# Patient Record
Sex: Male | Born: 1980 | Hispanic: No | Marital: Single | Smoking: Current every day smoker
Health system: Southern US, Community
[De-identification: ages and names within clinical notes are randomized; demographics above are authoritative.]

## PROBLEM LIST (undated history)

## (undated) DIAGNOSIS — F329 Major depressive disorder, single episode, unspecified: Secondary | ICD-10-CM

## (undated) DIAGNOSIS — R0600 Dyspnea, unspecified: Secondary | ICD-10-CM

## (undated) DIAGNOSIS — F32A Depression, unspecified: Secondary | ICD-10-CM

## (undated) DIAGNOSIS — Q249 Congenital malformation of heart, unspecified: Secondary | ICD-10-CM

## (undated) DIAGNOSIS — L732 Hidradenitis suppurativa: Secondary | ICD-10-CM

## (undated) DIAGNOSIS — F17200 Nicotine dependence, unspecified, uncomplicated: Secondary | ICD-10-CM

## (undated) HISTORY — DX: Depression, unspecified: F32.A

## (undated) HISTORY — DX: Dyspnea, unspecified: R06.00

## (undated) HISTORY — DX: Hidradenitis suppurativa: L73.2

## (undated) HISTORY — DX: Congenital malformation of heart, unspecified: Q24.9

## (undated) HISTORY — DX: Nicotine dependence, unspecified, uncomplicated: F17.200

## (undated) HISTORY — DX: Major depressive disorder, single episode, unspecified: F32.9

---

## 2008-08-05 ENCOUNTER — Encounter: Admission: RE | Admit: 2008-08-05 | Discharge: 2008-08-05 | Payer: Self-pay | Admitting: Pulmonary Disease

## 2009-10-17 ENCOUNTER — Ambulatory Visit: Payer: Self-pay | Admitting: Internal Medicine

## 2010-03-14 ENCOUNTER — Encounter (INDEPENDENT_AMBULATORY_CARE_PROVIDER_SITE_OTHER): Payer: Self-pay | Admitting: Family Medicine

## 2010-03-14 ENCOUNTER — Ambulatory Visit: Payer: Self-pay | Admitting: Internal Medicine

## 2010-03-14 LAB — CONVERTED CEMR LAB
ALT: 27 units/L (ref 0–53)
AST: 15 units/L (ref 0–37)
Albumin: 4.4 g/dL (ref 3.5–5.2)
CO2: 28 meq/L (ref 19–32)
Cholesterol: 196 mg/dL (ref 0–200)
Creatinine, Ser: 0.8 mg/dL (ref 0.40–1.50)
HDL: 32 mg/dL — ABNORMAL LOW (ref >39)
Sodium: 139 meq/L (ref 135–145)
Total Bilirubin: 0.3 mg/dL (ref 0.3–1.2)
Total Protein: 6.3 g/dL (ref 6.0–8.3)
Triglycerides: 308 mg/dL — ABNORMAL HIGH (ref <150)

## 2010-07-04 ENCOUNTER — Emergency Department (HOSPITAL_COMMUNITY)
Admission: EM | Admit: 2010-07-04 | Discharge: 2010-07-04 | Payer: Self-pay | Source: Home / Self Care | Admitting: Emergency Medicine

## 2011-10-18 ENCOUNTER — Emergency Department (HOSPITAL_COMMUNITY)
Admission: EM | Admit: 2011-10-18 | Discharge: 2011-10-18 | Disposition: A | Discharge status: Home / Self Care | Payer: PRIVATE HEALTH INSURANCE | Attending: Emergency Medicine | Admitting: Emergency Medicine

## 2011-10-18 ENCOUNTER — Other Ambulatory Visit: Payer: Self-pay

## 2011-10-18 ENCOUNTER — Encounter (HOSPITAL_COMMUNITY): Payer: Self-pay | Admitting: *Deleted

## 2011-10-18 DIAGNOSIS — R0682 Tachypnea, not elsewhere classified: Secondary | ICD-10-CM | POA: Insufficient documentation

## 2011-10-18 DIAGNOSIS — R0602 Shortness of breath: Secondary | ICD-10-CM | POA: Insufficient documentation

## 2011-10-18 LAB — D-DIMER, QUANTITATIVE: D-Dimer, Quant: <0.22 ug/mL-FEU (ref 0.00–0.48)

## 2011-10-18 MED ORDER — IPRATROPIUM BROMIDE 0.02 % IN SOLN
0.5000 mg | Freq: Once | RESPIRATORY_TRACT | Status: AC
  Administered 2011-10-18: 0.5 mg via RESPIRATORY_TRACT
  Filled 2011-10-18: qty 2.5

## 2011-10-18 MED ORDER — LORAZEPAM 1 MG PO TABS
1.0000 mg | ORAL_TABLET | Freq: Once | ORAL | Status: AC
  Administered 2011-10-18: 1 mg via ORAL
  Filled 2011-10-18: qty 1

## 2011-10-18 MED ORDER — ALBUTEROL SULFATE (5 MG/ML) 0.5% IN NEBU
5.0000 mg | INHALATION_SOLUTION | Freq: Once | RESPIRATORY_TRACT | Status: AC
  Administered 2011-10-18: 5 mg via RESPIRATORY_TRACT
  Filled 2011-10-18: qty 1

## 2011-10-18 NOTE — ED Notes (Signed)
Pt sent over from PMD with difficulty breathing. At triage window O2 sat 99 RA. Pt not in distress, respirations unlabored.

## 2011-10-18 NOTE — ED Provider Notes (Signed)
History     CSN: [id number]  Arrival date & time 10/18/11  1813   First MD Initiated Contact with Patient 10/18/11 2012      Chief Complaint  Patient presents with  . Shortness of Breath    (Consider location/radiation/quality/duration/timing/severity/associated sxs/prior treatment) HPI Comments: Patient presents with acute onset of shortness of breath that started 3 days ago. 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 At that time he had a negative EKG, normal CBC, and normal chest x-ray. He was improved with breathing treatments. He was discharged with an albuterol inhaler and steroids. Shortness of breath waxed and waned until today when it became much worse this afternoon. Patient went back to the clinic and was sent to the emergency department for further evaluation. Patient is from [place]. He denies recent travel, mobilizations, surgeries, history of blood clots. Patient had rheumatic fever when he was a child. He denies fever, upper respiratory tract infection symptoms, chest pain, nausea, vomiting, diarrhea. Patient is a smoker.  Patient is a 31 y.o. male presenting with shortness of breath. The history is provided by the patient.  Shortness of Breath  The current episode started 3 to 5 days ago. The onset was sudden. The problem has been unchanged. The problem is mild. The symptoms are relieved by nothing. Associated symptoms include shortness of breath. Pertinent negatives include no chest pain, no fever, no rhinorrhea, no sore throat, no cough and no wheezing.    History reviewed. No pertinent past medical history.  History reviewed. No pertinent past surgical history.  History reviewed. No pertinent family history.  History  Substance Use Topics  . Smoking status: Not on file  . Smokeless tobacco: Not on file  . Alcohol Use: Not on file      Review of Systems  Constitutional: Negative for fever.  HENT: Negative for sore throat and rhinorrhea.   Eyes:  Negative for redness.  Respiratory: Positive for shortness of breath. Negative for cough and wheezing.   Cardiovascular: Negative for chest pain.  Gastrointestinal: Negative for nausea, vomiting, abdominal pain and diarrhea.  Genitourinary: Negative for dysuria.  Musculoskeletal: Negative for myalgias.  Skin: Negative for rash.  Neurological: Negative for headaches.    Allergies  Review of patient's allergies indicates no known allergies.  Home Medications   Current Outpatient Rx  Name Route Sig Dispense Refill  . ALBUTEROL SULFATE HFA 108 (90 BASE) MCG/ACT IN AERS Inhalation Inhale 2 puffs into the lungs every 6 (six) hours as needed. For shortness of breath    . FLUTICASONE PROPIONATE  HFA 44 MCG/ACT IN AERO Inhalation Inhale 1 puff into the lungs 2 (two) times daily.    Marland Kitchen PREDNISONE 20 MG PO TABS Oral Take 20 mg by mouth daily.      BP 115/76  Pulse 67  Temp(Src) 98.9 F (37.2 C) (Oral)  Resp 22  Wt 164 lb (74.39 kg)  SpO2 100%  Physical Exam  Nursing note and vitals reviewed. Constitutional: He is oriented to person, place, and time. He appears well-developed and well-nourished.  HENT:  Head: Normocephalic and atraumatic.  Right Ear: External ear normal.  Left Ear: External ear normal.  Nose: Nose normal.  Mouth/Throat: Oropharynx is clear and moist.  Eyes: Conjunctivae are normal. Pupils are equal, round, and reactive to light. Right eye exhibits no discharge. Left eye exhibits no discharge.  Neck: Normal range of motion. Neck supple.  Cardiovascular: Normal rate, regular rhythm, normal heart sounds and intact distal pulses.  No murmur heard.      No murmur upright or when leaning forward.  Pulmonary/Chest: Breath sounds normal. No accessory muscle usage. Tachypnea noted. No respiratory distress. He has no decreased breath sounds. He has no wheezes.       Mild tachypnea, 22 respirations per minute  Abdominal: Soft. There is no tenderness.  Musculoskeletal: He  exhibits no edema.  Neurological: He is alert and oriented to person, place, and time.  Skin: Skin is warm and dry.  Psychiatric: He has a normal mood and affect.    ED Course  Procedures (including critical care time)   Labs Reviewed  D-DIMER, QUANTITATIVE   No results found.   1. Shortness of breath     8:50 PM Patient seen and examined. Work-up initiated. Medications ordered.   Vital signs reviewed and are as follows: Filed Vitals:   10/18/11 1913  BP: 115/76  Pulse: 67  Temp: 98.9 F (37.2 C)  Resp: 22    Date: 10/18/2011  Rate: 67  Rhythm: normal sinus rhythm  QRS Axis: normal  Intervals: normal  ST/T Wave abnormalities: normal  Conduction Disutrbances:none  Narrative Interpretation:   Old EKG Reviewed: patient had inverted t-wave in lead III in EKG performed at [facility] on 10/15/11. Otherwise unchanged.   8:50 PM Albuterol given. Patient was discussed with Dr. Juleen China. Dr. Juleen China has seen the patient. D-dimer ordered.  Patient stated that the albuterol did not improve his shortness of breath but made him nervous. Ativan ordered given nervousness. Patient informed of negative d-dimer. Patient states that overall he is feeling improved from earlier this afternoon. He is comfortable and agrees with followup with [facility] doctors. He will decide with his doctors whether an echocardiogram is necessary.  Patient given strict return instructions including worsening shortness of breath, syncope, chest pain, fever, or if he has any other concerns. Patient urged to continue using albuterol as needed and take full course of prednisone.  Reassured patient that testing to date has been normal and unconcerning.   MDM  Patient with shortness of breath, negative x-ray (done previously), unconcerning EKG, negative d-dimer. History of rheumatic fever but no murmur on exam. The patient may need echocardiogram as outpatient. Patient is satting normally. He is not having chest pain. No  life-threatening or dangerous conditions identified or suspected tonight. He has good PCP follow up. Strict return instructions given. Patient appears well and is satting normally at time of discharge.       Renne Crigler, [place] 10/19/11 [id number]

## 2011-10-18 NOTE — ED Provider Notes (Signed)
Medical screening examination/treatment/procedure(s) were conducted as a shared visit with non-physician practitioner(s) and myself.  I personally evaluated the patient during the encounter.  31yM with dyspnea. No distress on exam and lungs clear with good air movement. Low risk for PE and normal d-dimer makes PE unlikely. Doubt infectious. Doubt pneumothorax. Pt doesn't appear to be particularly anxious. Etiology unclear but low suspicion for emergent etiology. Plan symptomatic care. Return precautions discussed.  Raeford Razor, MD 10/18/11 [phone]

## 2011-10-18 NOTE — ED Notes (Signed)
RT called by Sherian Maroon, RN and will be coming shortly.

## 2011-10-18 NOTE — Discharge Instructions (Signed)
Please read and follow all provided instructions.  Your diagnoses today include:  1. Shortness of breath     Tests performed today include:  D-dimer, a test to rule-out blood clots that was negative.   Vital signs. See below for your results today.   Medications prescribed:   None  Home care instructions:  Follow any educational materials contained in this packet.  Follow-up instructions: Please follow-up with your primary care provider in the next 2 days for further evaluation of your symptoms. If you do not have a primary care doctor -- see below for referral information.   Continue using albuterol and taking prednisone as prescribed.   Return instructions:   Please return to the Emergency Department if you experience worsening symptoms.   Return with worsening shortness of breath or trouble breathing.  Return if you pass out or have chest pain.  Please return if you have any other emergent concerns.  Additional Information:  Your vital signs today were: BP 115/76  Pulse 67  Temp(Src) 98.9 F (37.2 C) (Oral)  Resp 22  Wt 164 lb (74.39 kg)  SpO2 98% If your blood pressure (BP) was elevated above 135/85 this visit, please have this repeated by your doctor within one month. -------------- No Primary Care Doctor Call [facility]  [phone] Other agencies that provide inexpensive medical care    [facility]  [phone]    [facility]  [phone]    [facility]  [phone]    [facility]  [phone]    [facility]  [phone]    [facility]  [phone] -------------- RESOURCE GUIDE:  Dental Problems  Patients with Medicaid: [facility] [phone] [address]                                            [phone] [address] Phone:  [phone]                                                   Phone:  [phone]  If unable to pay or uninsured, contact:  [facility]  or [facility]. to become qualified for the adult dental clinic.  Chronic Pain Problems Contact [facility]  [phone] Patients need to be referred by their primary care doctor.  Insufficient Money for Medicine Contact [employer]:  call "211" or [facility] [phone].  Psychological Services [facility]  [phone] [facility]  [phone] [facility]   [phone] (emergency services [phone])  Substance Abuse Resources [facility]  [phone] [facility] [phone] [facility] [phone] Floydene Flock 2097658413 Residential & Outpatient Substance Abuse Program  [phone]  Abuse/Neglect [facility] Child Abuse Hotline 878-282-0180 [facility] Child Abuse Hotline (626)115-6703 (After Hours)  Emergency Shelter [facility] [phone]  Maternity Homes Room at the [place] [facility] [place] [phone] [facility] [phone]  [facility]  [facility] Dept. [address] [place]                       [address]      [address]  Blondell Reveal Phone:  [phone]                                   Phone:  [phone]                 Phone:  [phone]  [facility] Phone:  [phone]  [facility] Child Abuse Hotline [phone] (After Hours)

## 2011-10-18 NOTE — ED Notes (Signed)
Pt in c/o shortness of breath and feeling like he can't take a deep breath, evaluated at [facility] medical office and had EKG and chest xray completed, sent for further evaulation

## 2011-10-19 NOTE — ED Provider Notes (Signed)
Medical screening examination/treatment/procedure(s) were conducted as a shared visit with non-physician practitioner(s) and myself.  I personally evaluated the patient during the encounter.  Please see completed note.  Raeford Razor, MD 10/19/11 0040

## 2013-03-23 ENCOUNTER — Institutional Professional Consult (permissible substitution): Payer: Self-pay | Admitting: Internal Medicine

## 2013-04-01 ENCOUNTER — Institutional Professional Consult (permissible substitution): Payer: Self-pay | Admitting: Internal Medicine

## 2013-04-07 ENCOUNTER — Institutional Professional Consult (permissible substitution): Payer: PRIVATE HEALTH INSURANCE | Admitting: Internal Medicine

## 2013-04-21 ENCOUNTER — Institutional Professional Consult (permissible substitution): Payer: PRIVATE HEALTH INSURANCE | Admitting: Internal Medicine

## 2013-04-28 ENCOUNTER — Encounter: Payer: Self-pay | Admitting: Internal Medicine

## 2013-04-28 ENCOUNTER — Ambulatory Visit (INDEPENDENT_AMBULATORY_CARE_PROVIDER_SITE_OTHER)
Admission: RE | Admit: 2013-04-28 | Discharge: 2013-04-28 | Disposition: A | Discharge status: Home / Self Care | Payer: BC Managed Care – PPO | Source: Ambulatory Visit | Attending: Internal Medicine | Admitting: Internal Medicine

## 2013-04-28 ENCOUNTER — Ambulatory Visit (INDEPENDENT_AMBULATORY_CARE_PROVIDER_SITE_OTHER): Payer: BC Managed Care – PPO | Admitting: Internal Medicine

## 2013-04-28 ENCOUNTER — Encounter: Payer: Self-pay | Admitting: *Deleted

## 2013-04-28 VITALS — BP 104/70 | HR 93 | Temp 97.5°F | Ht 68.0 in | Wt 174.8 lb

## 2013-04-28 DIAGNOSIS — R0989 Other specified symptoms and signs involving the circulatory and respiratory systems: Secondary | ICD-10-CM

## 2013-04-28 DIAGNOSIS — R0600 Dyspnea, unspecified: Secondary | ICD-10-CM

## 2013-04-28 DIAGNOSIS — R0609 Other forms of dyspnea: Secondary | ICD-10-CM

## 2013-04-28 NOTE — Progress Notes (Signed)
  Subjective:    Patient ID: Dillon Hogan, male    DOB: [birth year]   MRN: [id number]  HPI  32 yo [place] male smoker studying business at [employer] new onset sob  in 2013   referred to pulmonary 04/28/2013 by Dr Reola Calkins   04/28/2013 1st [facility] office visit/ [name] cc intermittent sob x one year sometimes with sense of chest tightness sometimes with ex, sometimes with sleep s pattern, ? Better p inhalers or prednisone.  Last episode x one month. Longest it's ever lasted = a few hours  shortest= a few minutes. Not necessarily provoked by ex as has worked out at y and gone biking over last month s significant doe.  No obvious day to day or daytime variabilty or assoc chronic cough or cp or   subjective wheeze overt sinus or hb symptoms. No unusual exp hx or h/o childhood pna/ asthma or knowledge of premature birth.  Sleeping ok without nocturnal  or early am exacerbation  of respiratory  c/o's or need for noct saba. Also denies any obvious fluctuation of symptoms with weather or environmental changes or other aggravating or alleviating factors except as outlined above   Current Medications, Allergies, Complete Past Medical History, Past Surgical History, Family History, and Social History were reviewed in [employer] record.         Review of Systems  Constitutional: Negative for fever and unexpected weight change.  HENT: Negative for congestion, dental problem, ear pain, nosebleeds, postnasal drip, rhinorrhea, sinus pressure, sneezing, sore throat and trouble swallowing.   Eyes: Negative for redness and itching.  Respiratory: Positive for chest tightness and shortness of breath. Negative for cough and wheezing.   Cardiovascular: Negative for palpitations and leg swelling.  Gastrointestinal: Negative for nausea and vomiting.  Genitourinary: Negative for dysuria.  Musculoskeletal: Negative for joint swelling.  Skin: Negative for rash.  Neurological: Negative for  headaches.  Hematological: Does not bruise/bleed easily.  Psychiatric/Behavioral: Negative for dysphoric mood. The patient is not nervous/anxious.        Objective:   Physical Exam  amb Iraqy male nad  Wt Readings from Last 3 Encounters:  04/28/13 174 lb 12.8 oz (79.289 kg)  10/18/11 164 lb (74.39 kg)      HEENT: nl dentition, turbinates, and orophanx. Nl external ear canals without cough reflex   NECK :  without JVD/Nodes/TM/ nl carotid upstrokes bilaterally   LUNGS: no acc muscle use, clear to A and P bilaterally without cough on insp or exp maneuvers   CV:  RRR  no s3 or murmur or increase in P2, no edema   ABD:  soft and nontender with nl excursion in the supine position. No bruits or organomegaly, bowel sounds nl  MS:  warm without deformities, calf tenderness, cyanosis or clubbing  SKIN: warm and dry without lesions    NEURO:  alert, approp, no deficits       CXR  04/28/2013 :  No active cardiopulmonary disease.         Assessment & Plan:

## 2013-04-28 NOTE — Patient Instructions (Signed)
Please remember to go to the x-ray department downstairs for your tests - we will call you with the results when they are available.  Please see patient coordinator before you leave today  to schedule a methacholine challenge test  The key is to stop smoking completely before smoking completely stops you!

## 2013-04-29 ENCOUNTER — Encounter: Payer: Self-pay | Admitting: Internal Medicine

## 2013-04-29 NOTE — Assessment & Plan Note (Signed)
Symptoms are markedly disproportionate to objective findings and not clear this is a lung problem but pt does appear to have difficult airway management issues. DDX of  difficult airways managment all start with A and  include Adherence, Ace Inhibitors, Acid Reflux, Active Sinus Disease, Alpha 1 Antitripsin deficiency, Anxiety masquerading as Airways dz,  ABPA,  allergy(esp in young), Aspiration (esp in elderly), Adverse effects of DPI,  Active smokers, plus two Bs  = Bronchiectasis and Beta blocker use..and one C= CHF  Anxiety is typically a dx of exclusion but the pattern of not responding to saba and not being proportionate to ex suggest panic disorder.  ? Allergy/asthma > rec methacholine challenge test  ? Acid (or non-acid) GERD > always difficult to exclude as up to 75% of pts in some series report no assoc GI/ Heartburn symptoms  For now will defer rx  Active smoking > discussed stopping

## 2013-04-30 ENCOUNTER — Ambulatory Visit (HOSPITAL_COMMUNITY)
Admission: RE | Admit: 2013-04-30 | Discharge: 2013-04-30 | Disposition: A | Discharge status: Home / Self Care | Payer: BC Managed Care – PPO | Source: Ambulatory Visit | Attending: Internal Medicine | Admitting: Internal Medicine

## 2013-04-30 DIAGNOSIS — R0989 Other specified symptoms and signs involving the circulatory and respiratory systems: Secondary | ICD-10-CM | POA: Insufficient documentation

## 2013-04-30 DIAGNOSIS — R0609 Other forms of dyspnea: Secondary | ICD-10-CM | POA: Insufficient documentation

## 2013-04-30 MED ORDER — ALBUTEROL SULFATE (5 MG/ML) 0.5% IN NEBU
2.5000 mg | INHALATION_SOLUTION | Freq: Once | RESPIRATORY_TRACT | Status: AC
  Administered 2013-04-30: 2.5 mg via RESPIRATORY_TRACT

## 2013-04-30 MED ORDER — SODIUM CHLORIDE 0.9 % IN NEBU
3.0000 mL | INHALATION_SOLUTION | Freq: Once | RESPIRATORY_TRACT | Status: AC
  Administered 2013-04-30: 3 mL via RESPIRATORY_TRACT

## 2013-04-30 MED ORDER — METHACHOLINE 0.0625 MG/ML NEB SOLN
2.0000 mL | Freq: Once | RESPIRATORY_TRACT | Status: AC
  Administered 2013-04-30: 0.125 mg via RESPIRATORY_TRACT

## 2013-04-30 MED ORDER — METHACHOLINE 1 MG/ML NEB SOLN
2.0000 mL | Freq: Once | RESPIRATORY_TRACT | Status: AC
  Administered 2013-04-30: 2 mg via RESPIRATORY_TRACT

## 2013-04-30 MED ORDER — METHACHOLINE 16 MG/ML NEB SOLN
2.0000 mL | Freq: Once | RESPIRATORY_TRACT | Status: AC
  Administered 2013-04-30: 32 mg via RESPIRATORY_TRACT

## 2013-04-30 MED ORDER — METHACHOLINE 4 MG/ML NEB SOLN
2.0000 mL | Freq: Once | RESPIRATORY_TRACT | Status: AC
  Administered 2013-04-30: 8 mg via RESPIRATORY_TRACT

## 2013-04-30 MED ORDER — METHACHOLINE 0.25 MG/ML NEB SOLN
2.0000 mL | Freq: Once | RESPIRATORY_TRACT | Status: AC
  Administered 2013-04-30: 0.5 mg via RESPIRATORY_TRACT

## 2013-04-30 NOTE — Progress Notes (Signed)
Quick Note:  Spoke with pt and notified of results per Dr. Wert. Pt verbalized understanding and denied any questions.  ______ 

## 2013-04-30 NOTE — Progress Notes (Signed)
Quick Note:  lmomtcb for pt ______ 

## 2013-05-17 ENCOUNTER — Telehealth: Payer: Self-pay | Admitting: Internal Medicine

## 2013-05-17 NOTE — Telephone Encounter (Signed)
Have not seen them, have them fax me the raw data

## 2013-05-17 NOTE — Telephone Encounter (Signed)
Pt is requesting MCT results from 04-30-13. Please advise if you have these results. If not I will call for them. Thanks. Carron Curie, CMA

## 2013-05-17 NOTE — Telephone Encounter (Signed)
Requested report to be faxed. Will await fax. Carron Curie, CMA

## 2013-05-18 NOTE — Telephone Encounter (Signed)
Report placed in MW look-at. Carron Curie, CMA

## 2013-05-19 NOTE — Telephone Encounter (Signed)
I spoke with patient about results and he verbalized understanding and had no questions 

## 2013-05-19 NOTE — Telephone Encounter (Signed)
Preliminary result is neg, needs ov with all meds in hand to regroup within 2 weeks if not 100% better by now

## 2013-10-05 ENCOUNTER — Encounter: Payer: Self-pay | Admitting: Cardiovascular Disease

## 2013-10-05 ENCOUNTER — Ambulatory Visit (INDEPENDENT_AMBULATORY_CARE_PROVIDER_SITE_OTHER): Payer: BC Managed Care – PPO | Admitting: Cardiovascular Disease

## 2013-10-05 ENCOUNTER — Encounter: Payer: Self-pay | Admitting: *Deleted

## 2013-10-05 VITALS — BP 104/72 | HR 101 | Ht 68.0 in | Wt 185.4 lb

## 2013-10-05 DIAGNOSIS — R0600 Dyspnea, unspecified: Secondary | ICD-10-CM

## 2013-10-05 DIAGNOSIS — L732 Hidradenitis suppurativa: Secondary | ICD-10-CM | POA: Insufficient documentation

## 2013-10-05 DIAGNOSIS — F32A Depression, unspecified: Secondary | ICD-10-CM | POA: Insufficient documentation

## 2013-10-05 DIAGNOSIS — R0989 Other specified symptoms and signs involving the circulatory and respiratory systems: Secondary | ICD-10-CM

## 2013-10-05 DIAGNOSIS — F329 Major depressive disorder, single episode, unspecified: Secondary | ICD-10-CM

## 2013-10-05 DIAGNOSIS — F17200 Nicotine dependence, unspecified, uncomplicated: Secondary | ICD-10-CM | POA: Insufficient documentation

## 2013-10-05 DIAGNOSIS — Q249 Congenital malformation of heart, unspecified: Secondary | ICD-10-CM | POA: Insufficient documentation

## 2013-10-05 DIAGNOSIS — R0609 Other forms of dyspnea: Secondary | ICD-10-CM

## 2013-10-05 DIAGNOSIS — F3289 Other specified depressive episodes: Secondary | ICD-10-CM

## 2013-10-05 NOTE — Assessment & Plan Note (Signed)
Doubt cardiac etiology Suspect it is related to his anxiety.  F/U echo

## 2013-10-05 NOTE — Assessment & Plan Note (Signed)
Seems to do better with welbutrin  Likely responsible form mild tachycardia  F/U Dr Piedad ClimesStiles

## 2013-10-05 NOTE — Progress Notes (Signed)
Patient ID: Roderic OvensFaisal Al Dabbagh, male   DOB: 06/06/1981, 33 y.o.   MRN: [id number]     33 yo referred [facility] [employer] [facility] for dyspnea  and history of rheumatic fever.  Has had depression and axniety on paxil initially but changed to welbutrin due to sluggishness  and followed by Dr Piedad ClimesStiles.    Seen by Dr Sherene SiresWert with no definitive diagnosis and negative methacholine challenge study   The history of rheumatic illness related to him by parents when he was a child in [place] He denies history of murmur  Non smoker Mild nasal allergies  No fever cough or sputum  Lifts weights and exercises at school with some exertional dyspna No LE edema or  Clotting abnormalities     ROS: Denies fever, malais, weight loss, blurry vision, decreased visual acuity, cough, sputum, SOB, hemoptysis, pleuritic pain, palpitaitons, heartburn, abdominal pain, melena, lower extremity edema, claudication, or rash.  All other systems reviewed and negative   General: Affect appropriate Healthy:  appears stated age HEENT: normal Neck supple with no adenopathy JVP normal no bruits no thyromegaly Lungs clear with no wheezing and good diaphragmatic motion Heart:  S1/S2 no murmur,rub, gallop or click PMI normal Abdomen: benighn, BS positve, no tenderness, no AAA no bruit.  No HSM or HJR Distal pulses intact with no bruits No edema Neuro non-focal Skin warm and dry No muscular weakness  Medications Current Outpatient Prescriptions  Medication Sig Dispense Refill  . buPROPion (WELLBUTRIN SR) 150 MG 12 hr tablet Take 150 mg by mouth 2 (two) times daily.       No current facility-administered medications for this visit.    Allergies Review of patient's allergies indicates no known allergies.  Family History: Family History  Problem Relation Age of Onset  . Leukemia Father     Social History: History   Social History  . Marital Status: Single    Spouse Name: N/A    Number of Children: N/A  . Years of  Education: N/A   Occupational History  . Not on file.   Social History Main Topics  . Smoking status: Current Every Day Smoker -- 0.50 packs/day for 18 years    Types: Cigarettes    Start date: 04/29/1995  . Smokeless tobacco: Not on file  . Alcohol Use: 6.0 oz/week    12 drink(s) per week  . Drug Use: Yes     Comment: occasional, less than 1/month  . Sexual Activity: Not on file   Other Topics Concern  . Not on file   Social History Narrative  . No narrative on file    Electrocardiogram:  SR rate 101  Otherwise normal   Assessment and Plan

## 2013-10-05 NOTE — Patient Instructions (Signed)

## 2013-10-19 ENCOUNTER — Encounter: Payer: Self-pay | Admitting: Cardiovascular Disease

## 2013-10-19 ENCOUNTER — Ambulatory Visit (HOSPITAL_COMMUNITY): Payer: BC Managed Care – PPO | Attending: Cardiovascular Disease | Admitting: Radiology

## 2013-10-19 DIAGNOSIS — R0602 Shortness of breath: Secondary | ICD-10-CM

## 2013-10-19 DIAGNOSIS — R0600 Dyspnea, unspecified: Secondary | ICD-10-CM

## 2013-10-19 DIAGNOSIS — R0609 Other forms of dyspnea: Secondary | ICD-10-CM | POA: Insufficient documentation

## 2013-10-19 DIAGNOSIS — R0989 Other specified symptoms and signs involving the circulatory and respiratory systems: Principal | ICD-10-CM | POA: Insufficient documentation

## 2013-10-19 NOTE — Progress Notes (Signed)
Echocardiogram performed.  

## 2015-02-07 IMAGING — CR DG CHEST 2V
2 series · 2 of 2 positions shown · non-contrast
Comparison: 05/05/2009

CLINICAL DATA: Shortness of breath. Asthma

EXAM:
CHEST  2 VIEW

[view not recorded (1 of 2)]
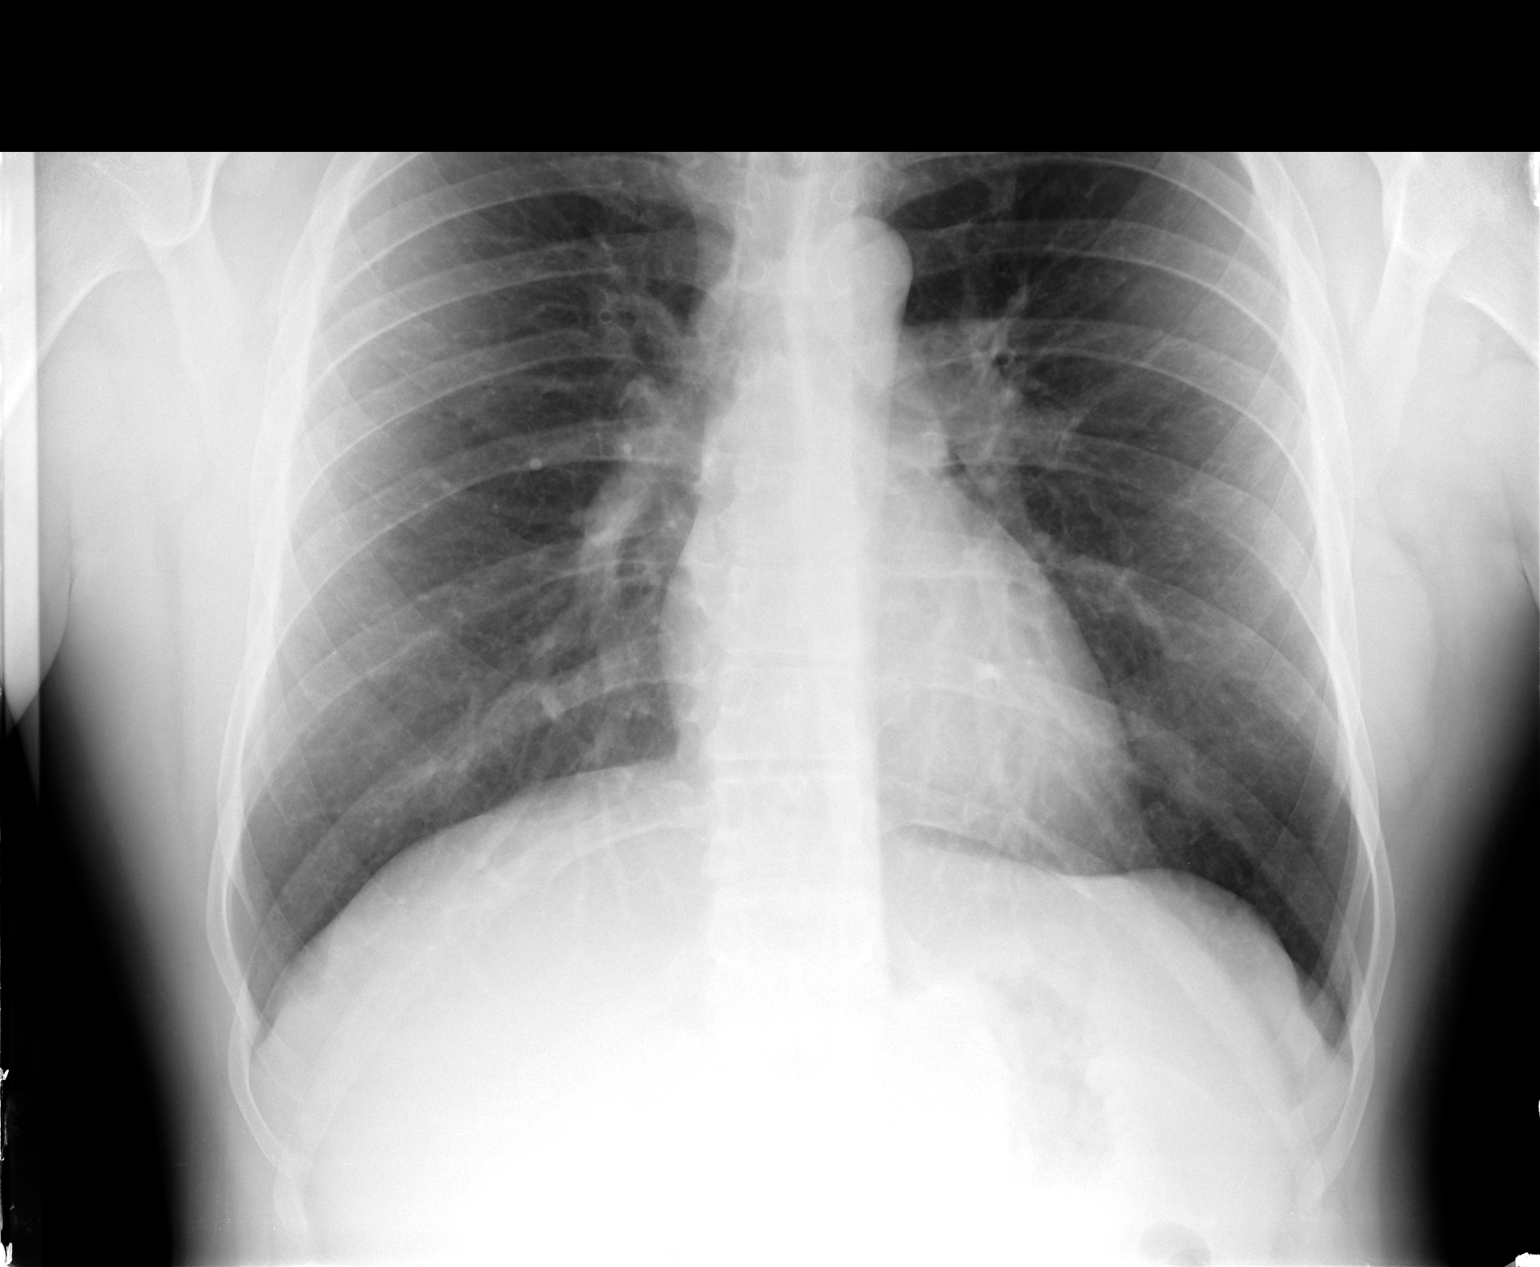

[view not recorded (2 of 2)]
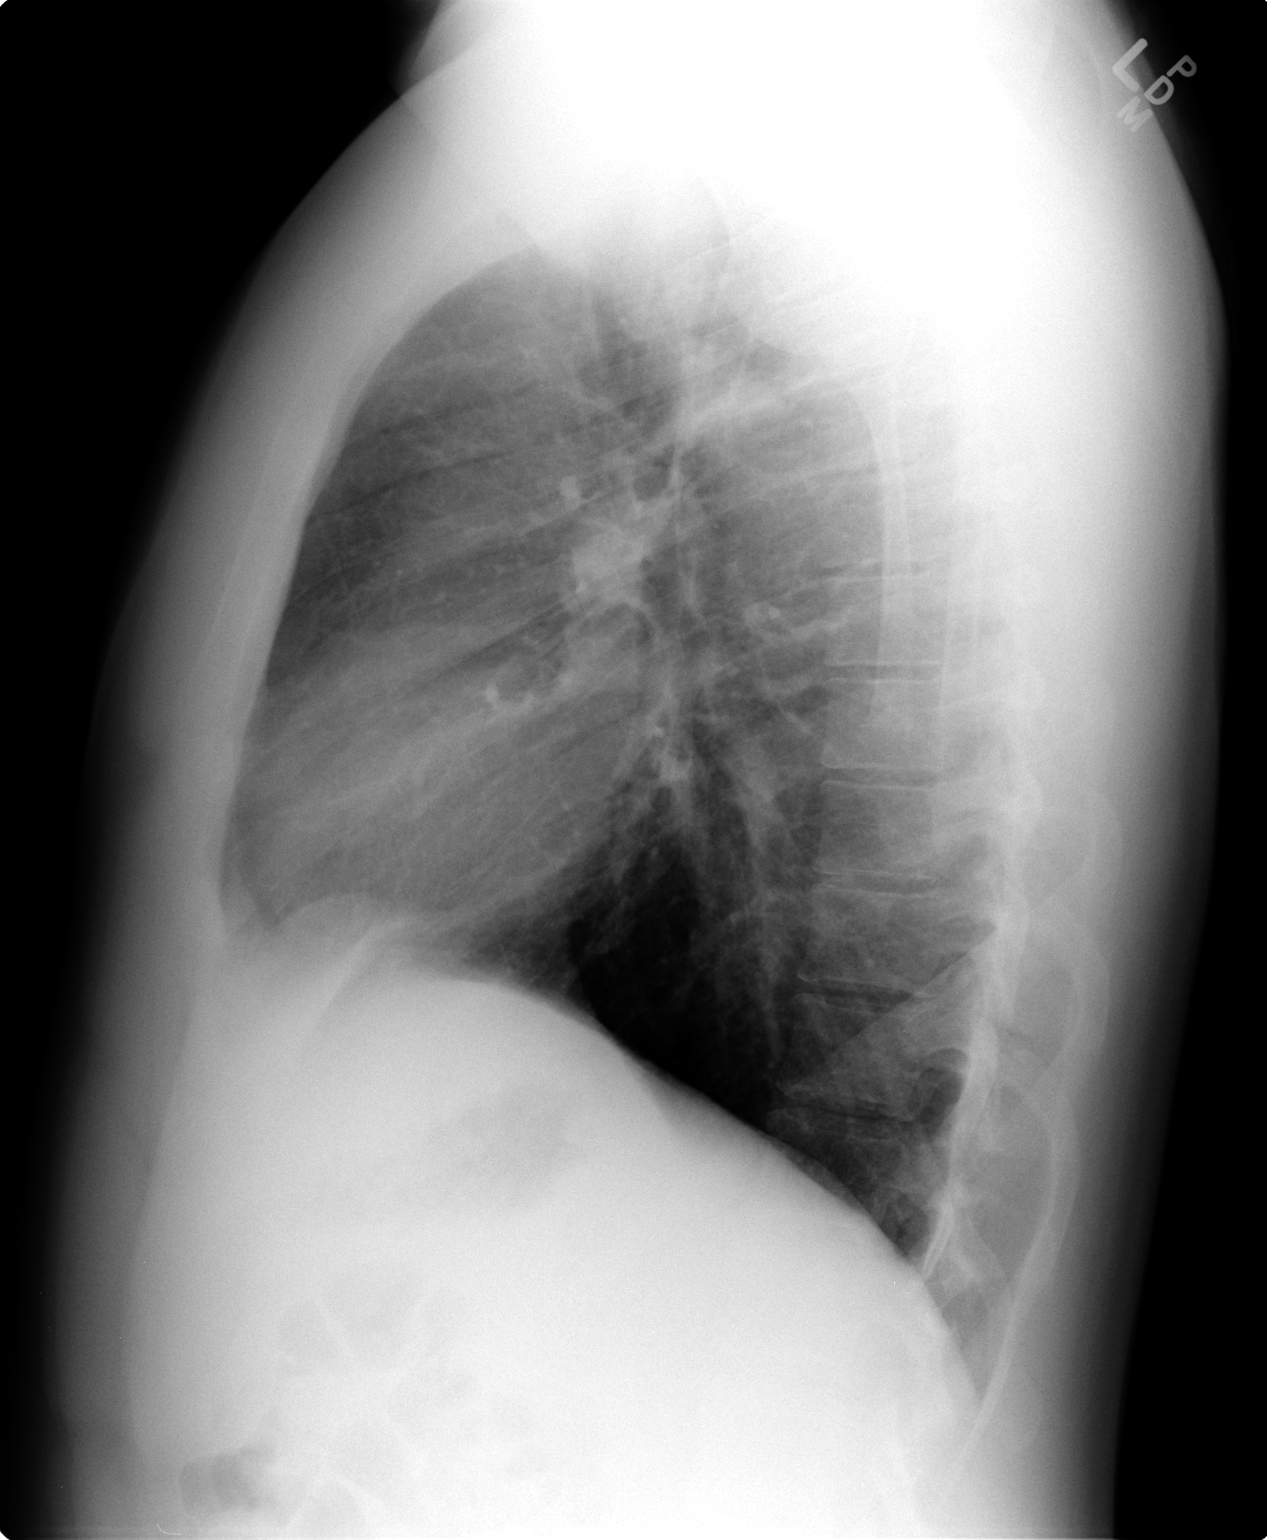

[2 of 2 positions shown; findings below may reference images not displayed]

FINDINGS: The heart size and mediastinal contours are within normal limits.
Both lungs are clear. The visualized skeletal structures are
unremarkable.
IMPRESSION: No active cardiopulmonary disease.
